# Patient Record
Sex: Female | Born: 1975 | Race: Black or African American | Hispanic: No | Marital: Single | Smoking: Never smoker
Health system: Southern US, Community
[De-identification: ages and names within clinical notes are randomized; demographics above are authoritative.]

## PROBLEM LIST (undated history)

## (undated) DIAGNOSIS — O24419 Gestational diabetes mellitus in pregnancy, unspecified control: Secondary | ICD-10-CM

## (undated) DIAGNOSIS — D649 Anemia, unspecified: Secondary | ICD-10-CM

## (undated) HISTORY — PX: NO PAST SURGERIES: SHX2092

## (undated) HISTORY — DX: Gestational diabetes mellitus in pregnancy, unspecified control: O24.419

## (undated) HISTORY — DX: Anemia, unspecified: D64.9

---

## 2003-08-02 ENCOUNTER — Encounter: Admission: RE | Admit: 2003-08-02 | Discharge: 2003-08-02 | Payer: Self-pay | Admitting: Family Medicine

## 2007-10-05 ENCOUNTER — Ambulatory Visit (HOSPITAL_COMMUNITY): Admission: RE | Admit: 2007-10-05 | Discharge: 2007-10-05 | Payer: Self-pay | Admitting: Obstetrics & Gynecology

## 2007-10-11 ENCOUNTER — Ambulatory Visit (HOSPITAL_COMMUNITY): Admission: RE | Admit: 2007-10-11 | Discharge: 2007-10-11 | Payer: Self-pay | Admitting: Obstetrics & Gynecology

## 2008-02-27 ENCOUNTER — Ambulatory Visit: Payer: Self-pay | Admitting: *Deleted

## 2008-02-27 ENCOUNTER — Inpatient Hospital Stay (HOSPITAL_COMMUNITY): Admission: AD | Admit: 2008-02-27 | Discharge: 2008-02-29 | Payer: Self-pay | Admitting: Family Medicine

## 2011-02-06 NOTE — Group Therapy Note (Signed)
   NAME:  Savannah Owens, Savannah Owens                             ACCOUNT NO.:  [id number]   MEDICAL RECORD NO.:  [id number]                   PATIENT TYPE:  OUT   LOCATION:  [facility]                           FACILITY:  [facility]   PHYSICIAN:  Argentina Donovan, MD                     DATE OF BIRTH:  Jan 18, 1976   DATE OF SERVICE:  08/02/2003                                    CLINIC NOTE   HISTORY OF PRESENT ILLNESS:  This patient is a 35 year old African female  [place] having delivered her first baby  six months ago.  She was seen in the clinic because of a lump near her  vagina which was a small inclusion cyst just inside the labia majoris on the  left side right near the fourchette.  It is painless.  She is not having any  pain with it.  I have discussed with her the treatment or since she is going  to want another baby soon it could be put off until she has the baby.  Since  she is not having any problem with it at the present time, there is no  reason to do any correction.   DIAGNOSES:  Inclusion cyst, vagina.  No treatment needed at this time.                                               Argentina Donovan, MD    PR/MEDQ  D:  08/02/2003  T:  08/03/2003  Job:  [id number]

## 2011-06-18 LAB — CBC
HCT: 24.3 — ABNORMAL LOW
HCT: 30.7 — ABNORMAL LOW
Hemoglobin: 10.5 — ABNORMAL LOW
Hemoglobin: 8.6 — ABNORMAL LOW
MCHC: 34.2
MCV: 87.1
MCV: 87.1
Platelets: 175
RBC: 3.53 — ABNORMAL LOW
RDW: 15.2
WBC: 9

## 2012-11-29 ENCOUNTER — Ambulatory Visit: Payer: No Typology Code available for payment source

## 2014-08-20 LAB — OB RESULTS CONSOLE RPR: RPR: NONREACTIVE

## 2014-08-20 LAB — OB RESULTS CONSOLE ANTIBODY SCREEN: Antibody Screen: NEGATIVE

## 2014-08-20 LAB — OB RESULTS CONSOLE ABO/RH: RH TYPE: POSITIVE

## 2014-08-20 LAB — OB RESULTS CONSOLE HIV ANTIBODY (ROUTINE TESTING): HIV: NONREACTIVE

## 2014-08-20 LAB — OB RESULTS CONSOLE HEPATITIS B SURFACE ANTIGEN: HEP B S AG: NEGATIVE

## 2014-08-20 LAB — OB RESULTS CONSOLE GC/CHLAMYDIA
CHLAMYDIA, DNA PROBE: NEGATIVE
GC PROBE AMP, GENITAL: NEGATIVE

## 2014-08-20 LAB — OB RESULTS CONSOLE RUBELLA ANTIBODY, IGM: RUBELLA: IMMUNE

## 2015-01-09 ENCOUNTER — Encounter: Payer: Medicaid Other | Attending: Obstetrics and Gynecology

## 2015-01-09 VITALS — Ht 64.0 in | Wt 180.1 lb

## 2015-01-09 DIAGNOSIS — R7302 Impaired glucose tolerance (oral): Secondary | ICD-10-CM | POA: Insufficient documentation

## 2015-01-09 DIAGNOSIS — Z713 Dietary counseling and surveillance: Secondary | ICD-10-CM | POA: Diagnosis not present

## 2015-01-09 DIAGNOSIS — R7309 Other abnormal glucose: Secondary | ICD-10-CM

## 2015-01-09 NOTE — Progress Notes (Signed)
  Patient was seen on 01/09/15 for Gestational Diabetes self-management class at the [facility]. The following learning objectives were met by the patient during this course:   States the definition of Gestational Diabetes  States why dietary management is important in controlling blood glucose  Describes the effects each nutrient has on blood glucose levels  Demonstrates ability to create a balanced meal plan  Demonstrates carbohydrate counting   States when to check blood glucose levels  Demonstrates proper blood glucose monitoring techniques  States the effect of stress and exercise on blood glucose levels  States the importance of limiting caffeine and abstaining from alcohol and smoking  Blood glucose monitor given: Accu Chek Nano BG Monitoring Kit  Lot # [id number] Exp: 09/21/15 Blood glucose reading: 78 mg/dl  Patient instructed to monitor glucose levels: FBS: 60 - <90 1 hour: <140 2 hour: <120  *Patient received handouts:  Nutrition Diabetes and Pregnancy  Carbohydrate Counting List  Patient will be seen for follow-up as needed.

## 2015-01-24 ENCOUNTER — Ambulatory Visit (HOSPITAL_COMMUNITY): Payer: Medicaid Other

## 2015-02-06 ENCOUNTER — Other Ambulatory Visit: Payer: Self-pay | Admitting: Obstetrics & Gynecology

## 2015-02-26 ENCOUNTER — Encounter (HOSPITAL_COMMUNITY): Payer: Self-pay

## 2015-02-27 ENCOUNTER — Other Ambulatory Visit (HOSPITAL_COMMUNITY): Payer: Medicaid Other

## 2015-02-27 ENCOUNTER — Encounter (HOSPITAL_COMMUNITY)
Admission: RE | Admit: 2015-02-27 | Discharge: 2015-02-27 | Disposition: A | Discharge status: Home / Self Care | Payer: Medicaid Other | Source: Ambulatory Visit | Attending: Obstetrics & Gynecology | Admitting: Obstetrics & Gynecology

## 2015-02-27 ENCOUNTER — Encounter (HOSPITAL_COMMUNITY): Payer: Self-pay

## 2015-02-27 LAB — BASIC METABOLIC PANEL
Anion gap: 6 (ref 5–15)
BUN: 7 mg/dL (ref 6–20)
CHLORIDE: 108 mmol/L (ref 101–111)
CO2: 24 mmol/L (ref 22–32)
Calcium: 8.9 mg/dL (ref 8.9–10.3)
Creatinine, Ser: 0.54 mg/dL (ref 0.44–1.00)
GFR calc non Af Amer: >60 mL/min (ref >60)
Glucose, Bld: 111 mg/dL — ABNORMAL HIGH (ref 65–99)
Potassium: 3.4 mmol/L — ABNORMAL LOW (ref 3.5–5.1)
Sodium: 138 mmol/L (ref 135–145)

## 2015-02-27 LAB — CBC
HEMATOCRIT: 29.9 % — AB (ref 36.0–46.0)
HEMOGLOBIN: 10.1 g/dL — AB (ref 12.0–15.0)
MCH: 27.9 pg (ref 26.0–34.0)
MCHC: 33.8 g/dL (ref 30.0–36.0)
MCV: 82.6 fL (ref 78.0–100.0)
Platelets: 160 10*3/uL (ref 150–400)
RBC: 3.62 MIL/uL — AB (ref 3.87–5.11)
RDW: 14.4 % (ref 11.5–15.5)
WBC: 6.1 10*3/uL (ref 4.0–10.5)

## 2015-02-27 LAB — ABO/RH: ABO/RH(D): O POS

## 2015-02-27 LAB — TYPE AND SCREEN
ABO/RH(D): O POS
Antibody Screen: NEGATIVE

## 2015-02-27 NOTE — Patient Instructions (Signed)
Your procedure is scheduled on:02/28/15  Enter through the Main Entrance at : 1030 am Pick up desk phone and dial [phone] and inform us of your arrival.  Please call [phone] if you have any problems the morning of surgery.  Remember: Do not eat food after midnight:tonight Clear liquids are ok until:8am on Thursday   You may brush your teeth the morning of surgery  DO NOT wear jewelry, eye make-up, lipstick,body lotion, or dark fingernail polish.  (Polished toes are ok) You may wear deodorant.  If you are to be admitted after surgery, leave suitcase in car until your room has been assigned. Patients discharged on the day of surgery will not be allowed to drive home. Wear loose fitting, comfortable clothes for your ride home.

## 2015-02-28 ENCOUNTER — Inpatient Hospital Stay (HOSPITAL_COMMUNITY)
Admission: AD | Admit: 2015-02-28 | Discharge: 2015-03-02 | DRG: 766 | Disposition: A | Discharge status: Home / Self Care | Payer: Medicaid Other | Source: Ambulatory Visit | Attending: Obstetrics & Gynecology | Admitting: Obstetrics & Gynecology

## 2015-02-28 ENCOUNTER — Inpatient Hospital Stay (HOSPITAL_COMMUNITY): Payer: Medicaid Other | Admitting: Anesthesiology

## 2015-02-28 ENCOUNTER — Encounter (HOSPITAL_COMMUNITY)
Admission: AD | Disposition: A | Discharge status: Home / Self Care | Payer: Self-pay | Source: Ambulatory Visit | Attending: Obstetrics & Gynecology

## 2015-02-28 ENCOUNTER — Encounter (HOSPITAL_COMMUNITY): Payer: Self-pay | Admitting: *Deleted

## 2015-02-28 DIAGNOSIS — O09523 Supervision of elderly multigravida, third trimester: Secondary | ICD-10-CM | POA: Diagnosis not present

## 2015-02-28 DIAGNOSIS — O99824 Streptococcus B carrier state complicating childbirth: Secondary | ICD-10-CM | POA: Diagnosis present

## 2015-02-28 DIAGNOSIS — O321XX0 Maternal care for breech presentation, not applicable or unspecified: Principal | ICD-10-CM | POA: Diagnosis present

## 2015-02-28 DIAGNOSIS — Z3A39 39 weeks gestation of pregnancy: Secondary | ICD-10-CM | POA: Diagnosis present

## 2015-02-28 DIAGNOSIS — O24420 Gestational diabetes mellitus in childbirth, diet controlled: Secondary | ICD-10-CM | POA: Diagnosis present

## 2015-02-28 DIAGNOSIS — Z302 Encounter for sterilization: Secondary | ICD-10-CM

## 2015-02-28 HISTORY — PX: TUBAL LIGATION: SHX77

## 2015-02-28 LAB — GLUCOSE, CAPILLARY
Glucose-Capillary: 86 mg/dL (ref 65–99)
Glucose-Capillary: 85 mg/dL (ref 65–99)

## 2015-02-28 LAB — RPR: RPR Ser Ql: NONREACTIVE

## 2015-02-28 SURGERY — Surgical Case
Anesthesia: Spinal | Site: Abdomen

## 2015-02-28 MED ORDER — OXYTOCIN 10 UNIT/ML IJ SOLN
INTRAMUSCULAR | Status: AC
  Filled 2015-02-28: qty 4

## 2015-02-28 MED ORDER — PHENYLEPHRINE HCL 10 MG/ML IJ SOLN
INTRAMUSCULAR | Status: DC | PRN
  Administered 2015-02-28: 40 ug via INTRAVENOUS
  Administered 2015-02-28: 80 ug via INTRAVENOUS
  Administered 2015-02-28 (×2): 40 ug via INTRAVENOUS
  Administered 2015-02-28 (×2): 80 ug via INTRAVENOUS

## 2015-02-28 MED ORDER — NALBUPHINE HCL 10 MG/ML IJ SOLN: 5.0000 mg | Freq: Once | INTRAMUSCULAR | Status: AC | PRN

## 2015-02-28 MED ORDER — HYDROMORPHONE HCL 1 MG/ML IJ SOLN: 0.2500 mg | INTRAMUSCULAR | Status: DC | PRN

## 2015-02-28 MED ORDER — LACTATED RINGERS IV SOLN
INTRAVENOUS | Status: DC
  Administered 2015-02-28: 23:00:00 via INTRAVENOUS

## 2015-02-28 MED ORDER — DEXTROSE 5 % IV SOLN
1.0000 ug/kg/h | INTRAVENOUS | Status: DC | PRN
  Filled 2015-02-28: qty 2

## 2015-02-28 MED ORDER — SIMETHICONE 80 MG PO CHEW
80.0000 mg | CHEWABLE_TABLET | ORAL | Status: DC
  Administered 2015-03-01 – 2015-03-02 (×2): 80 mg via ORAL
  Filled 2015-02-28 (×2): qty 1

## 2015-02-28 MED ORDER — TETANUS-DIPHTH-ACELL PERTUSSIS 5-2.5-18.5 LF-MCG/0.5 IM SUSP: 0.5000 mL | Freq: Once | INTRAMUSCULAR | Status: DC

## 2015-02-28 MED ORDER — CEFAZOLIN SODIUM-DEXTROSE 2-3 GM-% IV SOLR
2.0000 g | INTRAVENOUS | Status: AC
  Administered 2015-02-28: 2 g via INTRAVENOUS

## 2015-02-28 MED ORDER — PRENATAL MULTIVITAMIN CH
1.0000 | ORAL_TABLET | Freq: Every day | ORAL | Status: DC
  Administered 2015-03-01 – 2015-03-02 (×2): 1 via ORAL
  Filled 2015-02-28 (×2): qty 1

## 2015-02-28 MED ORDER — ONDANSETRON HCL 4 MG/2ML IJ SOLN: 4.0000 mg | Freq: Three times a day (TID) | INTRAMUSCULAR | Status: DC | PRN

## 2015-02-28 MED ORDER — DIPHENHYDRAMINE HCL 25 MG PO CAPS
25.0000 mg | ORAL_CAPSULE | Freq: Four times a day (QID) | ORAL | Status: DC | PRN
Start: 2015-02-28 — End: 2015-03-02

## 2015-02-28 MED ORDER — SCOPOLAMINE 1 MG/3DAYS TD PT72
MEDICATED_PATCH | TRANSDERMAL | Status: AC
  Administered 2015-02-28: 1.5 mg via TRANSDERMAL
  Filled 2015-02-28: qty 1

## 2015-02-28 MED ORDER — IBUPROFEN 600 MG PO TABS
600.0000 mg | ORAL_TABLET | Freq: Four times a day (QID) | ORAL | Status: DC
  Administered 2015-02-28 – 2015-03-02 (×8): 600 mg via ORAL
  Filled 2015-02-28 (×8): qty 1

## 2015-02-28 MED ORDER — SODIUM CHLORIDE 0.9 % IJ SOLN: 3.0000 mL | INTRAMUSCULAR | Status: DC | PRN

## 2015-02-28 MED ORDER — SIMETHICONE 80 MG PO CHEW: 80.0000 mg | CHEWABLE_TABLET | ORAL | Status: DC | PRN

## 2015-02-28 MED ORDER — LACTATED RINGERS IV SOLN
INTRAVENOUS | Status: DC | PRN
  Administered 2015-02-28: 13:00:00 via INTRAVENOUS

## 2015-02-28 MED ORDER — DIPHENHYDRAMINE HCL 25 MG PO CAPS
25.0000 mg | ORAL_CAPSULE | ORAL | Status: DC | PRN
  Administered 2015-02-28 – 2015-03-01 (×2): 25 mg via ORAL
  Filled 2015-02-28 (×3): qty 1

## 2015-02-28 MED ORDER — BUPIVACAINE HCL (PF) 0.25 % IJ SOLN
INTRAMUSCULAR | Status: AC
  Filled 2015-02-28: qty 30

## 2015-02-28 MED ORDER — SIMETHICONE 80 MG PO CHEW
80.0000 mg | CHEWABLE_TABLET | Freq: Three times a day (TID) | ORAL | Status: DC
  Administered 2015-02-28 – 2015-03-02 (×4): 80 mg via ORAL
  Filled 2015-02-28 (×4): qty 1

## 2015-02-28 MED ORDER — FENTANYL CITRATE (PF) 100 MCG/2ML IJ SOLN
INTRAMUSCULAR | Status: AC
  Filled 2015-02-28: qty 2

## 2015-02-28 MED ORDER — VITAMIN K1 1 MG/0.5ML IJ SOLN
INTRAMUSCULAR | Status: AC
Start: 2015-02-28 — End: 2015-03-01
  Filled 2015-02-28: qty 0.5

## 2015-02-28 MED ORDER — FENTANYL CITRATE (PF) 100 MCG/2ML IJ SOLN
INTRAMUSCULAR | Status: DC | PRN
  Administered 2015-02-28: 25 ug via INTRATHECAL

## 2015-02-28 MED ORDER — DIPHENHYDRAMINE HCL 50 MG/ML IJ SOLN: 12.5000 mg | INTRAMUSCULAR | Status: DC | PRN

## 2015-02-28 MED ORDER — NALOXONE HCL 0.4 MG/ML IJ SOLN: 0.4000 mg | INTRAMUSCULAR | Status: DC | PRN

## 2015-02-28 MED ORDER — NALBUPHINE HCL 10 MG/ML IJ SOLN
5.0000 mg | INTRAMUSCULAR | Status: DC | PRN
  Administered 2015-02-28: 5 mg via INTRAVENOUS
  Filled 2015-02-28: qty 1

## 2015-02-28 MED ORDER — ONDANSETRON HCL 4 MG/2ML IJ SOLN
INTRAMUSCULAR | Status: DC | PRN
  Administered 2015-02-28: 4 mg via INTRAVENOUS

## 2015-02-28 MED ORDER — CEFAZOLIN SODIUM-DEXTROSE 2-3 GM-% IV SOLR
INTRAVENOUS | Status: AC
  Filled 2015-02-28: qty 50

## 2015-02-28 MED ORDER — SCOPOLAMINE 1 MG/3DAYS TD PT72: 1.0000 | MEDICATED_PATCH | Freq: Once | TRANSDERMAL | Status: DC

## 2015-02-28 MED ORDER — LANOLIN HYDROUS EX OINT: 1.0000 "application " | TOPICAL_OINTMENT | CUTANEOUS | Status: DC | PRN

## 2015-02-28 MED ORDER — OXYCODONE-ACETAMINOPHEN 5-325 MG PO TABS
1.0000 | ORAL_TABLET | ORAL | Status: DC | PRN
  Administered 2015-03-02 (×2): 1 via ORAL
  Filled 2015-02-28 (×2): qty 1

## 2015-02-28 MED ORDER — ACETAMINOPHEN 500 MG PO TABS
1000.0000 mg | ORAL_TABLET | Freq: Four times a day (QID) | ORAL | Status: AC
  Administered 2015-03-01 (×2): 1000 mg via ORAL
  Filled 2015-02-28 (×2): qty 2

## 2015-02-28 MED ORDER — ACETAMINOPHEN 325 MG PO TABS: 650.0000 mg | ORAL_TABLET | ORAL | Status: DC | PRN

## 2015-02-28 MED ORDER — DIBUCAINE 1 % RE OINT: 1.0000 "application " | TOPICAL_OINTMENT | RECTAL | Status: DC | PRN

## 2015-02-28 MED ORDER — LACTATED RINGERS IV SOLN
INTRAVENOUS | Status: DC
  Administered 2015-02-28 (×4): via INTRAVENOUS

## 2015-02-28 MED ORDER — NALBUPHINE HCL 10 MG/ML IJ SOLN: 5.0000 mg | INTRAMUSCULAR | Status: DC | PRN

## 2015-02-28 MED ORDER — PHENYLEPHRINE 8 MG IN D5W 100 ML (0.08MG/ML) PREMIX OPTIME
INJECTION | INTRAVENOUS | Status: DC | PRN
  Administered 2015-02-28: 60 ug/min via INTRAVENOUS

## 2015-02-28 MED ORDER — WITCH HAZEL-GLYCERIN EX PADS: 1.0000 "application " | MEDICATED_PAD | CUTANEOUS | Status: DC | PRN

## 2015-02-28 MED ORDER — OXYCODONE-ACETAMINOPHEN 5-325 MG PO TABS
2.0000 | ORAL_TABLET | ORAL | Status: DC | PRN
  Administered 2015-03-01 – 2015-03-02 (×6): 2 via ORAL
  Filled 2015-02-28 (×6): qty 2

## 2015-02-28 MED ORDER — MEPERIDINE HCL 25 MG/ML IJ SOLN: 6.2500 mg | INTRAMUSCULAR | Status: DC | PRN

## 2015-02-28 MED ORDER — SENNOSIDES-DOCUSATE SODIUM 8.6-50 MG PO TABS
2.0000 | ORAL_TABLET | ORAL | Status: DC
  Administered 2015-03-01 – 2015-03-02 (×2): 2 via ORAL
  Filled 2015-02-28 (×2): qty 2

## 2015-02-28 MED ORDER — BUPIVACAINE HCL (PF) 0.25 % IJ SOLN
INTRAMUSCULAR | Status: DC | PRN
Start: 2015-02-28 — End: 2015-02-28
  Administered 2015-02-28: 30 mL

## 2015-02-28 MED ORDER — ERYTHROMYCIN 5 MG/GM OP OINT
TOPICAL_OINTMENT | OPHTHALMIC | Status: AC
  Filled 2015-02-28: qty 1

## 2015-02-28 MED ORDER — SCOPOLAMINE 1 MG/3DAYS TD PT72
1.0000 | MEDICATED_PATCH | Freq: Once | TRANSDERMAL | Status: DC
  Administered 2015-02-28: 1.5 mg via TRANSDERMAL

## 2015-02-28 MED ORDER — OXYTOCIN 40 UNITS IN LACTATED RINGERS INFUSION - SIMPLE MED: 62.5000 mL/h | INTRAVENOUS | Status: AC

## 2015-02-28 MED ORDER — MENTHOL 3 MG MT LOZG: 1.0000 | LOZENGE | OROMUCOSAL | Status: DC | PRN

## 2015-02-28 MED ORDER — BUPIVACAINE LIPOSOME 1.3 % IJ SUSP
20.0000 mL | Freq: Once | INTRAMUSCULAR | Status: AC
  Administered 2015-02-28: 20 mL
  Filled 2015-02-28: qty 20

## 2015-02-28 MED ORDER — ONDANSETRON HCL 4 MG/2ML IJ SOLN
INTRAMUSCULAR | Status: AC
  Filled 2015-02-28: qty 2

## 2015-02-28 MED ORDER — MORPHINE SULFATE (PF) 0.5 MG/ML IJ SOLN
INTRAMUSCULAR | Status: DC | PRN
  Administered 2015-02-28: .1 mg via INTRATHECAL

## 2015-02-28 MED ORDER — ZOLPIDEM TARTRATE 5 MG PO TABS: 5.0000 mg | ORAL_TABLET | Freq: Every evening | ORAL | Status: DC | PRN

## 2015-02-28 MED ORDER — LACTATED RINGERS IV SOLN
Freq: Once | INTRAVENOUS | Status: AC
  Administered 2015-02-28: 11:00:00 via INTRAVENOUS

## 2015-02-28 MED ORDER — MORPHINE SULFATE 0.5 MG/ML IJ SOLN
INTRAMUSCULAR | Status: AC
  Filled 2015-02-28: qty 10

## 2015-02-28 MED ORDER — BUPIVACAINE HCL (PF) 0.25 % IJ SOLN
INTRAMUSCULAR | Status: AC
Start: 2015-02-28 — End: 2015-02-28
  Filled 2015-02-28: qty 30

## 2015-02-28 MED ORDER — OXYTOCIN 10 UNIT/ML IJ SOLN
40.0000 [IU] | INTRAMUSCULAR | Status: DC | PRN
  Administered 2015-02-28: 40 [IU] via INTRAVENOUS

## 2015-02-28 MED ORDER — PHENYLEPHRINE 8 MG IN D5W 100 ML (0.08MG/ML) PREMIX OPTIME
INJECTION | INTRAVENOUS | Status: AC
  Filled 2015-02-28: qty 100

## 2015-02-28 SURGICAL SUPPLY — 33 items
BENZOIN TINCTURE PRP APPL 2/3 (GAUZE/BANDAGES/DRESSINGS) ×4 IMPLANT
CLAMP CORD UMBIL (MISCELLANEOUS) ×4 IMPLANT
CLIP FILSHIE TUBAL LIGA STRL (Clip) ×4 IMPLANT
CLOSURE WOUND 1/2 X4 (GAUZE/BANDAGES/DRESSINGS) ×1
CLOTH BEACON ORANGE TIMEOUT ST (SAFETY) ×4 IMPLANT
DRAPE SHEET LG 3/4 BI-LAMINATE (DRAPES) ×4 IMPLANT
DRSG OPSITE POSTOP 4X10 (GAUZE/BANDAGES/DRESSINGS) ×4 IMPLANT
DURAPREP 26ML APPLICATOR (WOUND CARE) ×4 IMPLANT
ELECT REM PT RETURN 9FT ADLT (ELECTROSURGICAL) ×4
ELECTRODE REM PT RTRN 9FT ADLT (ELECTROSURGICAL) ×2 IMPLANT
GLOVE BIO SURGEON STRL SZ 6.5 (GLOVE) ×3 IMPLANT
GLOVE BIO SURGEONS STRL SZ 6.5 (GLOVE) ×1
GLOVE BIOGEL PI IND STRL 7.0 (GLOVE) ×2 IMPLANT
GLOVE BIOGEL PI INDICATOR 7.0 (GLOVE) ×2
GOWN STRL REUS W/TWL LRG LVL3 (GOWN DISPOSABLE) ×12 IMPLANT
NEEDLE HYPO 22GX1.5 SAFETY (NEEDLE) ×4 IMPLANT
NS IRRIG 1000ML POUR BTL (IV SOLUTION) ×4 IMPLANT
PACK C SECTION WH (CUSTOM PROCEDURE TRAY) ×4 IMPLANT
PAD ABD 7.5X8 STRL (GAUZE/BANDAGES/DRESSINGS) ×4 IMPLANT
PAD OB MATERNITY 4.3X12.25 (PERSONAL CARE ITEMS) ×4 IMPLANT
RTRCTR C-SECT PINK 25CM LRG (MISCELLANEOUS) ×4 IMPLANT
SPONGE GAUZE 4X4 12PLY STER LF (GAUZE/BANDAGES/DRESSINGS) ×8 IMPLANT
STRIP CLOSURE SKIN 1/2X4 (GAUZE/BANDAGES/DRESSINGS) ×3 IMPLANT
SUT CHROMIC 2 0 CT 1 (SUTURE) ×8 IMPLANT
SUT MNCRL 0 VIOLET CTX 36 (SUTURE) ×4 IMPLANT
SUT MONOCRYL 0 CTX 36 (SUTURE) ×4
SUT VIC AB 0 CTX 36 (SUTURE) ×4
SUT VIC AB 0 CTX36XBRD ANBCTRL (SUTURE) ×4 IMPLANT
SUT VIC AB 4-0 KS 27 (SUTURE) ×4 IMPLANT
SYR CONTROL 10ML LL (SYRINGE) ×4 IMPLANT
TAPE CLOTH SURG 4X10 WHT LF (GAUZE/BANDAGES/DRESSINGS) ×4 IMPLANT
TOWEL OR 17X24 6PK STRL BLUE (TOWEL DISPOSABLE) ×4 IMPLANT
TRAY FOLEY CATH SILVER 14FR (SET/KITS/TRAYS/PACK) ×4 IMPLANT

## 2015-02-28 NOTE — OR Nursing (Signed)
Dr Cristela Blue will sign pt out. Margarita Mail rn

## 2015-02-28 NOTE — Op Note (Addendum)
Cesarean Section Procedure Note  Indications: 39 week SIUP breech presentation, Desired permanent sterilization  Pre-operative Diagnosis: 39 week 2 day pregnancy, complete breech presentation  Post-operative Diagnosis: same  Procedure:  Primary cesarean delivery                        Bilateral tubal ligation  Surgeon: Wynonia Hazard, MD  Assistants: Marlow Baars,  MD  Anesthesia: Spinal anesthesia  ASA Class: 2  Procedure Details   The patient was counseled about the risks, benefits, complications of the cesarean section. The patient concurred with the proposed plan, giving informed consent.  The site of surgery properly noted/marked. The patient was taken to Operating Room # 9, identified as Savannah Owens and the procedure verified as C-Section Delivery. A Time Out was held and the above information confirmed.  After epidural was found to adequate , the patient was placed in the dorsal supine position with a leftward tilt, draped and prepped in the usual sterile manner. A Pfannenstiel incision was made and carried down through the subcutaneous tissue to the fascia.  The fascia was incised in the midline and the fascial incision was extended laterally with Mayo scissors. The superior aspect of the fascial incision was grasped with Coker clamps x2, tented up and the rectus muscles dissected off sharply with the bovie.  The rectus was then dissected off with blunt dissection and Mayo scissors inferiorly. The rectus muscles were separated in the midline. The abdominal peritoneum was identified, tented up, entered sharply using Metzenbaum scissors, and the incision was extended superiorly and inferiorly with good visualization of the bladder. The Alexis retractor was then deployed. The vesicouterine peritoneum was identified, tented up, entered sharply with Metzenbaum scissors, and the bladder flap was created digitally. Scalpel was then used to make a low transverse incision on the uterus  which was extended laterally with  blunt dissection. The fetal breech was identified, each leg was delivered  The body was delivered until the shoulders.  Each arm was delivered via Pinard maneuver. The head was delivered via Domenic Schwab, [place] maneuver. The A live female infant was bulb suctioned on the operative field cried vigorously, cord was clamped and cut and the infant was passed to the waiting neonatologist. Apgars 8/9. Placenta was then delivered spontaneously, intact and appear normal, the uterus was cleared of all clot and debris. The uterine incision was repaired with #0 Monocryl in running locked fashion. A second imbricating suture was performed using the same suture. The incision was hemostatic. Ovaries and tubes were inspected and normal. The uterus was delivered from the abdomen and Filshie clips were applied to each fallopian tube at the isthmic portion. The uterus was replaced in the abdomen. Exparel was placed on each tube. The Alexis retractor was removed. The abdominal cavity was cleared of all clot and debris. The abdominal peritoneum was reapproximated with 2-0 chromic  in a running fashion, the rectus muscles was reapproximated with #2 chromic in interrupted fashion. The fascia was closed with 0 Vicryl in a running fashion. The subcuticular layer was irrigated and all bleeders cauterized.  20 mL of Exparel diluted in 30 mL of 0.25% Marcaine was injected into the subcutaneous layer.  The Scarpas fascia was re-approximated with interrupted sutures of 2-0 plain.  The skin was closed with 4-0 vicryl in a subcuticular fashion using a Mellody Dance needle. The incision was dressed with benzoine, steri strips and pressure dressing. All sponge lap and needle counts were correct  x3.   Patient tolerated the procedure well and recovered in stable condition following the procedure.  Instrument, sponge, and needle counts were correct prior the abdominal closure and at the conclusion of the case.    Findings: Live female infant, Apgars 8/9, clear amniotic fluid, normal appearing placenta, normal uterus, bilateral tubes and ovaries  Estimated Blood Loss:  IVF:  3000 mL LR         Drains: Foley catheter  Urine output: 450 mL clear         Specimens: Placenta to L&D         Implants: none         Complications:  None; patient tolerated the procedure well.         Disposition: PACU - hemodynamically stable.   Savannah Owens Savannah Owens

## 2015-02-28 NOTE — Lactation Note (Signed)
This note was copied from the chart of Savannah Owens. Lactation Consultation Note. Initial visit at 8 hours of age.  Mom reports baby is sleepy and she doesn't know how much he is getting.  RN reports  A good feeding earlier.  Baby asleep in crib, but mom want to try to latch baby.  Last feeding about 1 hour ago for about 10 minutes.  Attempted hand expression for several minutes.  Left breast soft and right more full.  Small drops of colostrum noted.  Encouraged mom to continue to hand express.  Assisted with football and then cross cradle latch.  Baby latched with mouth open and few sucks but did not maintain feeding.  Baby asleep STS with mom.  Hand pump given with instructions.  Mom has WIC and reports they will give her a pump in 3 months.  Discussed on demand feedings for 2 weeks and then to try some pumping. [facility] LC resources given and discussed.  Encouraged to feed with early cues on demand.  Early newborn behavior discussed.  Mom to call for assist as needed.     Patient Name: Savannah Owens [id number] Date: 02/28/2015 Reason for consult: Initial assessment   Maternal Data Has patient been taught Hand Expression?: Yes Does the patient have breastfeeding experience prior to this delivery?: Yes  Feeding Feeding Type: Breast Fed Length of feed:  (few minutes)  LATCH Score/Interventions Latch: Repeated attempts needed to sustain latch, nipple held in mouth throughout feeding, stimulation needed to elicit sucking reflex. Intervention(s): Adjust position;Assist with latch;Breast massage;Breast compression  Audible Swallowing: None Intervention(s): Skin to skin Intervention(s): Skin to skin  Type of Nipple: Everted at rest and after stimulation  Comfort (Breast/Nipple): Soft / non-tender     Hold (Positioning): Assistance needed to correctly position infant at breast and maintain latch. Intervention(s): Breastfeeding basics reviewed;Support Pillows;Position options;Skin to skin  LATCH  Score: 6  Lactation Tools Discussed/Used WIC Program: Yes Pump Review: Setup, frequency, and cleaning;Milk Storage Date initiated:: 02/28/15   Consult Status Consult Status: Follow-up Date: 03/01/15 Follow-up type: In-patient    Hoyte Ziebell, Jannifer Rodney RN BSN IBCLC 02/28/2015, 9:34 PM

## 2015-02-28 NOTE — Transfer of Care (Signed)
Immediate Anesthesia Transfer of Care Note  Patient: Savannah Owens  Procedure(s) Performed: Procedure(s): CESAREAN SECTION (N/A) BILATERAL TUBAL LIGATION (Bilateral)  Patient Location: PACU  Anesthesia Type:Spinal  Level of Consciousness: awake, alert  and oriented  Airway & Oxygen Therapy: Patient Spontanous Breathing  Post-op Assessment: Report given to RN and Post -op Vital signs reviewed and stable  Post vital signs: Reviewed and stable  Last Vitals:  Filed Vitals:   02/28/15 1036  BP: 114/69  Pulse: 73  Temp: 36.4 C  Resp: 18    Complications: No apparent anesthesia complications

## 2015-02-28 NOTE — Anesthesia Postprocedure Evaluation (Signed)
  Anesthesia Post-op Note  Patient: Savannah Owens  Procedure(s) Performed: Procedure(s): CESAREAN SECTION (N/A) BILATERAL TUBAL LIGATION (Bilateral)  Patient is awake, responsive, moving her legs, and has signs of resolution of her numbness. Pain and nausea are reasonably well controlled. Vital signs are stable and clinically acceptable. Oxygen saturation is clinically acceptable. There are no apparent anesthetic complications at this time. Patient is ready for discharge.

## 2015-02-28 NOTE — Anesthesia Preprocedure Evaluation (Signed)
Anesthesia Evaluation  Patient identified by MRN, date of birth, ID band Patient awake    Reviewed: Allergy & Precautions, H&P , Patient's Chart, lab work & pertinent test results  Airway Mallampati: II  TM Distance: >3 FB Neck ROM: full    Dental no notable dental hx.    Pulmonary  breath sounds clear to auscultation  Pulmonary exam normal       Cardiovascular Exercise Tolerance: Good Rhythm:regular Rate:Normal     Neuro/Psych    GI/Hepatic   Endo/Other  diabetes, Type 2  Renal/GU      Musculoskeletal   Abdominal   Peds  Hematology   Anesthesia Other Findings   Reproductive/Obstetrics                             Anesthesia Physical Anesthesia Plan  ASA: II  Anesthesia Plan: Spinal   Post-op Pain Management:    Induction:   Airway Management Planned:   Additional Equipment:   Intra-op Plan:   Post-operative Plan:   Informed Consent: I have reviewed the patients History and Physical, chart, labs and discussed the procedure including the risks, benefits and alternatives for the proposed anesthesia with the patient or authorized representative who has indicated his/her understanding and acceptance.   Dental Advisory Given  Plan Discussed with: CRNA  Anesthesia Plan Comments: (Lab work confirmed with CRNA in room. Platelets okay. Discussed spinal anesthetic, and patient consents to the procedure:  included risk of possible headache,backache, failed block, allergic reaction, and nerve injury. This patient was asked if she had any questions or concerns before the procedure started. )        Anesthesia Quick Evaluation

## 2015-02-28 NOTE — H&P (Signed)
Savannah Owens is a 39 y.o. female presenting for elective primary cesarean delivery for breech presentation. She denies ctx, no vaginal bleeding, no leaking of fluid. She notes good fetal movement.   Maternal Medical History:  Reason for admission: Nausea. Scheduled primary cesarean delivery for breech presentation  Contractions: Frequency: rare.   Perceived severity is mild.    Fetal activity: Perceived fetal activity is normal.   Last perceived fetal movement was within the past 12 hours.    Prenatal complications: no prenatal complications Prenatal Complications - Diabetes: gestational. Diabetes is managed by diet.      OB History    Gravida Para Term Preterm AB TAB SAB Ectopic Multiple Living   3 2        2      Past Medical History  Diagnosis Date  . GDM (gestational diabetes mellitus)   . Anemia    Past Surgical History  Procedure Laterality Date  . No past surgeries     Family History: family history is not on file. Social History:  reports that she has never smoked. She does not have any smokeless tobacco history on file. She reports that she does not drink alcohol or use illicit drugs.   Prenatal Transfer Tool  Maternal Diabetes: Yes:  Diabetes Type:  Diet controlled Genetic Screening: Normal Maternal Ultrasounds/Referrals: Normal Fetal Ultrasounds or other Referrals:  Other: Anatomy scan normal Maternal Substance Abuse:  No Significant Maternal Medications:  None Significant Maternal Lab Results:  Lab values include: Group B Strep positive Other Comments:  None  Review of Systems  Constitutional: Negative.  Negative for fever and chills.  HENT: Negative for hearing loss.   Eyes: Negative.  Negative for blurred vision and double vision.  Respiratory: Negative for cough.   Cardiovascular: Negative for chest pain and palpitations.  Gastrointestinal: Negative for heartburn and nausea.  Genitourinary: Negative for dysuria and urgency.  Musculoskeletal:  Negative for myalgias.  Skin: Negative for itching and rash.  Neurological: Negative for dizziness, tingling and headaches.  Psychiatric/Behavioral: Negative for depression and suicidal ideas.  All other systems reviewed and are negative.     Blood pressure 114/69, pulse 73, temperature 97.5 F (36.4 C), temperature source Oral, resp. rate 18, last menstrual period 05/28/2014, SpO2 100 %. Maternal Exam:  Uterine Assessment: Contraction frequency is rare.   Abdomen: Patient reports no abdominal tenderness. Fundal height is 40 cm.   Estimated fetal weight is 3600 grams.   Fetal presentation: breech  Introitus: Normal vulva. Normal vagina.  Ferning test: not done.  Nitrazine test: not done. Amniotic fluid character: not assessed.     Fetal Exam Fetal Monitor Review: Baseline rate: 140.  Variability: moderate (6-25 bpm).   Pattern: accelerations present and no decelerations.    Fetal State Assessment: Category I - tracings are normal.     Physical Exam  Vitals reviewed. Constitutional: She appears well-developed and well-nourished.  HENT:  Head: Normocephalic.  Eyes: Pupils are equal, round, and reactive to light.  Neck: Normal range of motion.  Respiratory: Effort normal.  GI: Soft.  Musculoskeletal: Normal range of motion.  Neurological: She is alert. She has normal reflexes.  Skin: Skin is warm.    Prenatal labs: ABO, Rh: --/--/O POS, O POS (06/08 1030) Antibody: NEG (06/08 1030) Rubella: Immune (11/30 0000) RPR: Non Reactive (06/08 1030)  HBsAg: Negative (11/30 0000)  HIV: Non-reactive (11/30 0000)  GBS:   POS  Assessment/Plan: 39 yo G3P2 at 39 weeks breech presentation A1GDM Patient on call to  OR for primary C-section Ancef preoperatively Patient desires permanent sterilization - BTL     Savannah Owens 02/28/2015, 11:47 AM

## 2015-02-28 NOTE — Anesthesia Procedure Notes (Signed)

## 2015-03-01 LAB — CBC
HCT: 25.7 % — ABNORMAL LOW (ref 36.0–46.0)
Hemoglobin: 8.7 g/dL — ABNORMAL LOW (ref 12.0–15.0)
MCH: 28 pg (ref 26.0–34.0)
MCHC: 33.9 g/dL (ref 30.0–36.0)
MCV: 82.6 fL (ref 78.0–100.0)
Platelets: 152 10*3/uL (ref 150–400)
RBC: 3.11 MIL/uL — ABNORMAL LOW (ref 3.87–5.11)
RDW: 14.5 % (ref 11.5–15.5)
WBC: 9.2 10*3/uL (ref 4.0–10.5)

## 2015-03-01 LAB — BIRTH TISSUE RECOVERY COLLECTION (PLACENTA DONATION)

## 2015-03-01 NOTE — Addendum Note (Signed)
Addendum  created 03/01/15 0815 by Algis Greenhouse, CRNA   Modules edited: Notes Section   Notes Section:  File: [id number]

## 2015-03-01 NOTE — Progress Notes (Signed)
Plan for circ as outpatient in office.

## 2015-03-01 NOTE — Anesthesia Postprocedure Evaluation (Signed)
Anesthesia Post Note  Patient: Savannah Owens  Procedure(s) Performed: Procedure(s) (LRB): CESAREAN SECTION (N/A) BILATERAL TUBAL LIGATION (Bilateral)  Anesthesia type: Spinal  Patient location: Mother/Baby  Post pain: Pain level controlled  Post assessment: Post-op Vital signs reviewed  Last Vitals:  Filed Vitals:   03/01/15 0600  BP: 104/55  Pulse: 68  Temp: 36.9 C  Resp: 18    Post vital signs: Reviewed  Level of consciousness: awake  Complications: No apparent anesthesia complications

## 2015-03-01 NOTE — Progress Notes (Signed)
UR chart review completed.  

## 2015-03-01 NOTE — Progress Notes (Signed)
Patient is doing well.  She is tolerating PO, ambulating, voiding.  Pain is controlled.  Lochia is appropriate  Filed Vitals:   03/01/15 0001 03/01/15 0149 03/01/15 0600 03/01/15 0900  BP:  101/55 104/55 110/62  Pulse:  68 68 73  Temp:  98.6 F (37 C) 98.5 F (36.9 C) 98.4 F (36.9 C)  TempSrc:      Resp: 18 18 18 16   SpO2:  100% 100% 99%    NAD Lungs:   clear to auscultation Heart:   RRR Abdomen:  soft, appropriate tenderness, incisions intact and without erythema or drainage ext:    Symmetric, no edema bilaterally  Lab Results  Component Value Date   WBC 9.2 03/01/2015   HGB 8.7* 03/01/2015   HCT 25.7* 03/01/2015   MCV 82.6 03/01/2015   PLT 152 03/01/2015    --/--/O POS, O POS (06/08 1030)/RImmune  A/P    39 y.o. G3P1001 POD 1 s/p 1 LTCS and BTL for breech presentation Routine post op and postpartum care.   Plan for circ tomorrow

## 2015-03-02 ENCOUNTER — Encounter (HOSPITAL_COMMUNITY): Payer: Self-pay | Admitting: Obstetrics & Gynecology

## 2015-03-02 LAB — GLUCOSE, CAPILLARY: Glucose-Capillary: 94 mg/dL (ref 65–99)

## 2015-03-02 MED ORDER — OXYCODONE-ACETAMINOPHEN 5-325 MG PO TABS
1.0000 | ORAL_TABLET | Freq: Four times a day (QID) | ORAL | Status: DC | PRN
Start: 2015-03-02 — End: 2017-11-19

## 2015-03-02 MED ORDER — IBUPROFEN 600 MG PO TABS: 600.0000 mg | ORAL_TABLET | Freq: Four times a day (QID) | ORAL | Status: AC | PRN

## 2015-03-02 NOTE — Discharge Instructions (Signed)
Pelvic rest x 2 weeks (no intercourse or tampons).  No tub baths or swimming for two weeks.    Do not drive until you are not taking narcotic pain medication.   Call your doctor if you have heavy vaginal bleeding (soaking through a pad an hour or more for >2 hours in a row), temperature >101F, severe nausea, vomiting, severe or worsening abdominal pain, dizziness, shortness of breath, chest pain or any other concerns.  Please take motrin every 6 hours.  Add percocet if your pain is not controlled by motrin alone. You have been prescribed an antibiotic.  Please take one tab twice daily for 3 days.   Postpartum Care After Cesarean Delivery After you deliver your newborn (postpartum period), the usual stay in the hospital is 24-72 hours. If there were problems with your labor or delivery, or if you have other medical problems, you might be in the hospital longer.  While you are in the hospital, you will receive help and instructions on how to care for yourself and your newborn during the postpartum period.  While you are in the hospital:  It is normal for you to have pain or discomfort from the incision in your abdomen. Be sure to tell your nurses when you are having pain, where the pain is located, and what makes the pain worse.  If you are breastfeeding, you may feel uncomfortable contractions of your uterus for a couple of weeks. This is normal. The contractions help your uterus get back to normal size.  It is normal to have some bleeding after delivery.  For the first 1-3 days after delivery, the flow is red and the amount may be similar to a period.  It is common for the flow to start and stop.  In the first few days, you may pass some small clots. Let your nurses know if you begin to pass large clots or your flow increases.  Do not  flush blood clots down the toilet before having the nurse look at them.  During the next 3-10 days after delivery, your flow should become more watery and  pink or brown-tinged in color.  Ten to fourteen days after delivery, your flow should be a small amount of yellowish-white discharge.  The amount of your flow will decrease over the first few weeks after delivery. Your flow may stop in 6-8 weeks. Most women have had their flow stop by 12 weeks after delivery.  You should change your sanitary pads frequently.  Wash your hands thoroughly with soap and water for at least 20 seconds after changing pads, using the toilet, or before holding or feeding your newborn.  Your intravenous (IV) tubing will be removed when you are drinking enough fluids.  The urine drainage tube (urinary catheter) that was inserted before delivery may be removed within 6-8 hours after delivery or when feeling returns to your legs. You should feel like you need to empty your bladder within the first 6-8 hours after the catheter has been removed.  In case you become weak, lightheaded, or faint, call your nurse before you get out of bed for the first time and before you take a shower for the first time.  Within the first few days after delivery, your breasts may begin to feel tender and full. This is called engorgement. Breast tenderness usually goes away within 48-72 hours after engorgement occurs. You may also notice milk leaking from your breasts. If you are not breastfeeding, do not stimulate your breasts. Breast  stimulation can make your breasts produce more milk.  Spending as much time as possible with your newborn is very important. During this time, you and your newborn can feel close and get to know each other. Having your newborn stay in your room (rooming in) will help to strengthen the bond with your newborn. It will give you time to get to know your newborn and become comfortable caring for your newborn.  Your hormones change after delivery. Sometimes the hormone changes can temporarily cause you to feel sad or tearful. These feelings should not last more than a few  days. If these feelings last longer than that, you should talk to your caregiver.  If desired, talk to your caregiver about methods of family planning or contraception.  Talk to your caregiver about immunizations. Your caregiver may want you to have the following immunizations before leaving the hospital:  Tetanus, diphtheria, and pertussis (Tdap) or tetanus and diphtheria (Td) immunization. It is very important that you and your family (including grandparents) or others caring for your newborn are up-to-date with the Tdap or Td immunizations. The Tdap or Td immunization can help protect your newborn from getting ill.  Rubella immunization.  Varicella (chickenpox) immunization.  Influenza immunization. You should receive this annual immunization if you did not receive the immunization during your pregnancy. Document Released: 06/01/2012 Document Reviewed: 06/01/2012 [facility] Patient Information 2015 [place], [place]. This information is not intended to replace advice given to you by your health care provider. Make sure you discuss any questions you have with your health care provider.

## 2015-03-02 NOTE — Discharge Summary (Signed)
Obstetric Discharge Summary Reason for Admission: cesarean section for breech presentation Prenatal Procedures: none Intrapartum Procedures: cesarean: low cervical, transverse Postpartum Procedures: none Complications-Operative and Postpartum: none HEMOGLOBIN  Date Value Ref Range Status  03/01/2015 8.7* 12.0 - 15.0 g/dL Final   HCT  Date Value Ref Range Status  03/01/2015 25.7* 36.0 - 46.0 % Final    Physical Exam:  General: alert, cooperative and appears stated age 39: appropriate Uterine Fundus: firm Incision: healing well, no significant drainage, no dehiscence DVT Evaluation: No evidence of DVT seen on physical exam.  Discharge Diagnoses: Term Pregnancy-delivered and and GDM  Discharge Information: Date: 03/02/2015 Activity: pelvic rest Diet: routine Medications: PNV, Ibuprofen, Colace, Iron and Percocet Condition: stable Instructions: refer to practice specific booklet Discharge to: home Follow-up Information    Follow up with PINN, Sanjuana Mae, MD In 10 days.   Specialty:  Obstetrics and Gynecology   Why:  For wound re-check   Contact information:   [address] [place] [address] [phone]       Newborn Data: Live born female  Birth Weight: 8 lb 5.7 oz (3790 g) APGAR: 8, 9  Home with mother.  Savannah Owens Savannah Owens 03/02/2015, 9:02 AM

## 2015-03-02 NOTE — Progress Notes (Addendum)
Patient is doing well.  She is tolerating PO, ambulating, voiding.  Pain is controlled.  Lochia is appropriate  Filed Vitals:   03/01/15 0900 03/01/15 1300 03/01/15 1902 03/02/15 0609  BP: 110/62 107/60 114/61 106/60  Pulse: 73 70 68 68  Temp: 98.4 F (36.9 C) 98.2 F (36.8 C) 98.5 F (36.9 C) 97.3 F (36.3 C)  TempSrc: Oral Oral Oral Oral  Resp: 16 18 18 18   SpO2: 99% 100%  100%    NAD Abdomen:  soft, appropriate tenderness, incisions intact and without erythema or drainage ext:    Symmetric, no edema bilaterally  Lab Results  Component Value Date   WBC 9.2 03/01/2015   HGB 8.7* 03/01/2015   HCT 25.7* 03/01/2015   MCV 82.6 03/01/2015   PLT 152 03/01/2015   BG 94  --/--/O POS, O POS (06/08 1030)/RImmune  A/P    39 y.o. G3P1001 POD #2 s/p 1 LTCS and BTL for breech presentation Routine post op and postpartum care.   Meeting all goals.  Desires d/c today.  GDMA1: plan 2hr gtt in offie at 6 wks Plan for circ as outpatient in office

## 2016-02-25 ENCOUNTER — Encounter (HOSPITAL_COMMUNITY): Payer: Self-pay

## 2016-02-25 ENCOUNTER — Emergency Department (HOSPITAL_COMMUNITY)
Admission: EM | Admit: 2016-02-25 | Discharge: 2016-02-25 | Disposition: A | Discharge status: Home / Self Care | Payer: Medicaid Other | Attending: Emergency Medicine | Admitting: Emergency Medicine

## 2016-02-25 DIAGNOSIS — R42 Dizziness and giddiness: Secondary | ICD-10-CM

## 2016-02-25 DIAGNOSIS — Z862 Personal history of diseases of the blood and blood-forming organs and certain disorders involving the immune mechanism: Secondary | ICD-10-CM | POA: Insufficient documentation

## 2016-02-25 DIAGNOSIS — Z3202 Encounter for pregnancy test, result negative: Secondary | ICD-10-CM | POA: Insufficient documentation

## 2016-02-25 DIAGNOSIS — R112 Nausea with vomiting, unspecified: Secondary | ICD-10-CM | POA: Insufficient documentation

## 2016-02-25 DIAGNOSIS — Z8632 Personal history of gestational diabetes: Secondary | ICD-10-CM | POA: Insufficient documentation

## 2016-02-25 LAB — POC URINE PREG, ED: Preg Test, Ur: NEGATIVE

## 2016-02-25 LAB — CBG MONITORING, ED: GLUCOSE-CAPILLARY: 89 mg/dL (ref 65–99)

## 2016-02-25 MED ORDER — SODIUM CHLORIDE 0.9 % IV BOLUS (SEPSIS)
1000.0000 mL | Freq: Once | INTRAVENOUS | Status: AC
  Administered 2016-02-25: 1000 mL via INTRAVENOUS

## 2016-02-25 MED ORDER — ACETAMINOPHEN 325 MG PO TABS
650.0000 mg | ORAL_TABLET | Freq: Once | ORAL | Status: AC
  Administered 2016-02-25: 650 mg via ORAL
  Filled 2016-02-25: qty 2

## 2016-02-25 MED ORDER — MECLIZINE HCL 25 MG PO TABS: 25.0000 mg | ORAL_TABLET | Freq: Three times a day (TID) | ORAL | Status: AC | PRN

## 2016-02-25 MED ORDER — MECLIZINE HCL 25 MG PO TABS
50.0000 mg | ORAL_TABLET | Freq: Once | ORAL | Status: AC
  Administered 2016-02-25: 50 mg via ORAL
  Filled 2016-02-25: qty 2

## 2016-02-25 NOTE — Discharge Instructions (Signed)
Benign Positional Vertigo Vertigo is the feeling that you or your surroundings are moving when they are not. Benign positional vertigo is the most common form of vertigo. The cause of this condition is not serious (is benign). This condition is triggered by certain movements and positions (is positional). This condition can be dangerous if it occurs while you are doing something that could endanger you or others, such as driving.  CAUSES In many cases, the cause of this condition is not known. It may be caused by a disturbance in an area of the inner ear that helps your brain to sense movement and balance. This disturbance can be caused by a viral infection (labyrinthitis), head injury, or repetitive motion. RISK FACTORS This condition is more likely to develop in:  Women.  People who are 50 years of age or older. SYMPTOMS Symptoms of this condition usually happen when you move your head or your eyes in different directions. Symptoms may start suddenly, and they usually last for less than a minute. Symptoms may include:  Loss of balance and falling.  Feeling like you are spinning or moving.  Feeling like your surroundings are spinning or moving.  Nausea and vomiting.  Blurred vision.  Dizziness.  Involuntary eye movement (nystagmus). Symptoms can be mild and cause only slight annoyance, or they can be severe and interfere with daily life. Episodes of benign positional vertigo may return (recur) over time, and they may be triggered by certain movements. Symptoms may improve over time. DIAGNOSIS This condition is usually diagnosed by medical history and a physical exam of the head, neck, and ears. You may be referred to a health care provider who specializes in ear, nose, and throat (ENT) problems (otolaryngologist) or a provider who specializes in disorders of the nervous system (neurologist). You may have additional testing, including:  MRI.  A CT scan.  Eye movement tests. Your  health care provider may ask you to change positions quickly while he or she watches you for symptoms of benign positional vertigo, such as nystagmus. Eye movement may be tested with an electronystagmogram (ENG), caloric stimulation, the Dix-Hallpike test, or the roll test.  An electroencephalogram (EEG). This records electrical activity in your brain.  Hearing tests. TREATMENT Usually, your health care provider will treat this by moving your head in specific positions to adjust your inner ear back to normal. Surgery may be needed in severe cases, but this is rare. In some cases, benign positional vertigo may resolve on its own in 2-4 weeks. HOME CARE INSTRUCTIONS Safety  Move slowly.Avoid sudden body or head movements.  Avoid driving.  Avoid operating heavy machinery.  Avoid doing any tasks that would be dangerous to you or others if a vertigo episode would occur.  If you have trouble walking or keeping your balance, try using a cane for stability. If you feel dizzy or unstable, sit down right away.  Return to your normal activities as told by your health care provider. Ask your health care provider what activities are safe for you. General Instructions  Take over-the-counter and prescription medicines only as told by your health care provider.  Avoid certain positions or movements as told by your health care provider.  Drink enough fluid to keep your urine clear or pale yellow.  Keep all follow-up visits as told by your health care provider. This is important. SEEK MEDICAL CARE IF:  You have a fever.  Your condition gets worse or you develop new symptoms.  Your family or friends   notice any behavioral changes.  Your nausea or vomiting gets worse.  You have numbness or a "pins and needles" sensation. SEEK IMMEDIATE MEDICAL CARE IF:  You have difficulty speaking or moving.  You are always dizzy.  You faint.  You develop severe headaches.  You have weakness in your  legs or arms.  You have changes in your hearing or vision.  You develop a stiff neck.  You develop sensitivity to light.   This information is not intended to replace advice given to you by your health care provider. Make sure you discuss any questions you have with your health care provider.   Document Released: 06/15/2006 Document Revised: 05/29/2015 Document Reviewed: 12/31/2014 Elsevier Interactive Patient Education 2016 [employer]  

## 2016-02-25 NOTE — ED Notes (Signed)
Pt. Coming from home via [facility] for dizziness. Pt. Reports laying in bed when the episode of dizziness started. The patient was found laying on the couch when EMS arrived and sts she was unable to move without feeling dizzy. Pt. Threw up as she was entering the EMS truck ~100 cc brown emesis. Pt. Denies any pain. Pt. Tearful upon arrival stating she is scared. Pt. Given 4mg  zofran en route.

## 2016-02-25 NOTE — ED Provider Notes (Signed)
CSN: [id number]     Arrival date & time 02/25/16  0957 History   First MD Initiated Contact with Patient 02/25/16 1011     Chief Complaint  Patient presents with  . Dizziness    Savannah Owens is a 40 y.o. female who presents to the ED Via EMS complaining of sudden onset of room spinning dizziness today. She reports she was laying in bed when her symptoms began. She reports her symptoms are worse with movement of her head. She feels the room is spinning around her. She is taking nothing for treatment of her symptoms today. She denies history of vertigo. Her last menstrual cycle was one week ago. While with the investigation of one episode of vomiting. She reports nausea resolved after Zofran by EMS. Patient denies fevers, headache, changes to her vision, numbness, tingling, weakness, urinary symptoms, chest pain, shortness of breath, abdominal pain, or diarrhea.  Patient is a 40 y.o. female presenting with dizziness. The history is provided by the patient. No language interpreter was used.  Dizziness Associated symptoms: nausea and vomiting   Associated symptoms: no chest pain, no diarrhea, no headaches, no shortness of breath and no weakness     Past Medical History  Diagnosis Date  . GDM (gestational diabetes mellitus)   . Anemia    Past Surgical History  Procedure Laterality Date  . No past surgeries    . Cesarean section N/A 02/28/2015    Procedure: CESAREAN SECTION;  Surgeon: Essie HartWalda Pinn, MD;  Location: [facility];  Service: Obstetrics;  Laterality: N/A;  . Tubal ligation Bilateral 02/28/2015    Procedure: BILATERAL TUBAL LIGATION;  Surgeon: Essie HartWalda Pinn, MD;  Location: [facility];  Service: Obstetrics;  Laterality: Bilateral;   History reviewed. No pertinent family history. Social History  Substance Use Topics  . Smoking status: Never Smoker   . Smokeless tobacco: None  . Alcohol Use: No   OB History    Gravida Para Term Preterm AB TAB SAB Ectopic Multiple Living   3 3 1       0 3      Review of Systems  Constitutional: Negative for fever and chills.  HENT: Negative for congestion, ear pain and sore throat.   Eyes: Negative for visual disturbance.  Respiratory: Negative for cough and shortness of breath.   Cardiovascular: Negative for chest pain.  Gastrointestinal: Positive for nausea and vomiting. Negative for abdominal pain and diarrhea.  Genitourinary: Negative for dysuria.  Musculoskeletal: Negative for back pain and neck pain.  Skin: Negative for rash.  Neurological: Positive for dizziness. Negative for seizures, syncope, weakness, light-headedness, numbness and headaches.      Allergies  Review of patient's allergies indicates no known allergies.  Home Medications   Prior to Admission medications   Medication Sig Start Date End Date Taking? Authorizing Provider  ibuprofen (ADVIL,MOTRIN) 600 MG tablet Take 1 tablet (600 mg total) by mouth every 6 (six) hours as needed for moderate pain or cramping. 03/02/15  Yes Marlow Baarsyanna Clark, MD  meclizine (ANTIVERT) 25 MG tablet Take 1 tablet (25 mg total) by mouth 3 (three) times daily as needed for dizziness. 02/25/16   Everlene FarrierWilliam Vermelle Cammarata, PA-C  oxyCODONE-acetaminophen (PERCOCET/ROXICET) 5-325 MG per tablet Take 1-2 tablets by mouth every 6 (six) hours as needed (for pain scale 4-7). Patient not taking: Reported on 02/25/2016 03/02/15   Marlow Baarsyanna Clark, MD   BP 111/66 mmHg  Pulse 64  Temp(Src) 97.7 F (36.5 C) (Oral)  Resp 10  Ht 5\' 3"  (1.6 m)  Wt 67.132 kg  BMI 26.22 kg/m2  SpO2 100% Physical Exam  Constitutional: She is oriented to person, place, and time. She appears well-developed and well-nourished. No distress.  Nontoxic appearing.  HENT:  Head: Normocephalic and atraumatic.  Right Ear: External ear normal.  Left Ear: External ear normal.  Mouth/Throat: Oropharynx is clear and moist. No oropharyngeal exudate.  Bilateral tympanic membranes are pearly-gray without erythema or loss of landmarks.  Throat clear.  Eyes:  Conjunctivae and EOM are normal. Pupils are equal, round, and reactive to light. Right eye exhibits no discharge. Left eye exhibits no discharge.  Neck: Normal range of motion. Neck supple. No JVD present.  Cardiovascular: Normal rate, regular rhythm, normal heart sounds and intact distal pulses.  Exam reveals no gallop and no friction rub.   No murmur heard. Pulmonary/Chest: Effort normal and breath sounds normal. No stridor. No respiratory distress. She has no wheezes. She has no rales.  Abdominal: Soft. There is no tenderness. There is no guarding.  Musculoskeletal: Normal range of motion. She exhibits no edema.  Good strength to her bilateral upper and lower extremities.  Lymphadenopathy:    She has no cervical adenopathy.  Neurological: She is alert and oriented to person, place, and time. No cranial nerve deficit. Coordination normal.  Patient is alert and oriented 3.  Cranial nerves are intact. Speech is clear and coherent. EOMs are intact. Sensation is intact in her bilateral upper and lower extremities.  Skin: Skin is warm and dry. No rash noted. She is not diaphoretic. No erythema. No pallor.  Psychiatric: She has a normal mood and affect. Her behavior is normal.  Nursing note and vitals reviewed.   ED Course  Procedures (including critical care time) Labs Review Labs Reviewed  CBG MONITORING, ED  POC URINE PREG, ED    Imaging Review No results found. I have personally reviewed and evaluated these images and lab results as part of my medical decision-making.   EKG Interpretation   Date/Time:  Tuesday February 25 2016 10:02:59 EDT Ventricular Rate:  66 PR Interval:  352 QRS Duration: 102 QT Interval:  385 QTC Calculation: 403 R Axis:   70 Text Interpretation:  Sinus rhythm Prolonged PR interval Otherwise normal  ECG No previous tracing Confirmed by KNOTT MD, DANIEL ([id number]) on 02/25/2016  10:12:40 AM      Filed Vitals:   02/25/16 1001 02/25/16 1134  BP: 122/77 111/66   Pulse: 65 64  Temp: 97.7 F (36.5 C)   TempSrc: Oral   Resp: 16 10  Height:  (1.6 m)   Weight: 67.132 kg   SpO2: 100% 100%     MDM   Meds given in ED:  Medications  acetaminophen (TYLENOL) tablet 650 mg (not administered)  meclizine (ANTIVERT) tablet 50 mg (50 mg Oral Given 02/25/16 1045)  sodium chloride 0.9 % bolus 1,000 mL (0 mLs Intravenous Stopped 02/25/16 1222)    New Prescriptions   MECLIZINE (ANTIVERT) 25 MG TABLET    Take 1 tablet (25 mg total) by mouth 3 (three) times daily as needed for dizziness.    Final diagnoses:  Vertigo   This  is a 41 y.o. female who presents to the ED Via [facility] complaining of sudden onset of room spinning dizziness today. She reports she was laying in bed when her symptoms began. She reports her symptoms are worse with movement of her head. She feels the room is spinning around her. She is taking nothing for treatment of her symptoms today.  She denies history of vertigo. Her last menstrual cycle was one week ago. While with the investigation of one episode of vomiting. She reports nausea resolved after Zofran by EMS. Patient denies fevers, headache, changes to her vision, numbness, tingling, weakness.  On exam the patient is afebrile and non-toxic appearing. She has no focal neurological deficits. She reports increased dizziness with movement of her head. Pregnancy test is negative. CBG is 89. Patient was provided with meclizine and a fluid bolus. At reevaluation patient reports her room spinning dizziness has resolved. She reports she feels fatigued. I suspect this is a side effect from the meclizine. Patient is able to ambulate with normal gait in the room. She denies feeling dizzy or lightheaded. She denies feeling off balance. Patient with vertigo. Will discharge with prescription for meclizine 25 mg 3 times a day. I discussed the treatment and expected course of vertigo. I encouraged her to follow-up with primary care. I discussed return  precautions. I advised the patient to follow-up with their primary care provider this week. I advised the patient to return to the emergency department with new or worsening symptoms or new concerns. The patient verbalized understanding and agreement with plan.      Everlene Farrier, PA-C 02/25/16 1233  Lyndal Pulley, MD 02/25/16 [phone]

## 2017-11-19 ENCOUNTER — Emergency Department (HOSPITAL_COMMUNITY): Payer: Medicaid Other

## 2017-11-19 ENCOUNTER — Encounter (HOSPITAL_COMMUNITY): Payer: Self-pay | Admitting: Emergency Medicine

## 2017-11-19 ENCOUNTER — Emergency Department (HOSPITAL_COMMUNITY)
Admission: EM | Admit: 2017-11-19 | Discharge: 2017-11-19 | Disposition: A | Discharge status: Home / Self Care | Payer: Medicaid Other | Attending: Emergency Medicine | Admitting: Emergency Medicine

## 2017-11-19 DIAGNOSIS — R51 Headache: Secondary | ICD-10-CM | POA: Insufficient documentation

## 2017-11-19 DIAGNOSIS — M25552 Pain in left hip: Secondary | ICD-10-CM | POA: Diagnosis not present

## 2017-11-19 DIAGNOSIS — R109 Unspecified abdominal pain: Secondary | ICD-10-CM | POA: Diagnosis not present

## 2017-11-19 LAB — I-STAT CHEM 8, ED
BUN: 10 mg/dL (ref 6–20)
Calcium, Ion: 1.22 mmol/L (ref 1.15–1.40)
Chloride: 103 mmol/L (ref 101–111)
Creatinine, Ser: 0.9 mg/dL (ref 0.44–1.00)
Glucose, Bld: 111 mg/dL — ABNORMAL HIGH (ref 65–99)
HEMATOCRIT: 37 % (ref 36.0–46.0)
HEMOGLOBIN: 12.6 g/dL (ref 12.0–15.0)
POTASSIUM: 3.8 mmol/L (ref 3.5–5.1)
Sodium: 141 mmol/L (ref 135–145)
TCO2: 26 mmol/L (ref 22–32)

## 2017-11-19 LAB — I-STAT BETA HCG BLOOD, ED (MC, WL, AP ONLY): I-stat hCG, quantitative: <5 m[IU]/mL (ref <5)

## 2017-11-19 MED ORDER — SODIUM CHLORIDE 0.9 % IV BOLUS (SEPSIS)
1000.0000 mL | Freq: Once | INTRAVENOUS | Status: AC
  Administered 2017-11-19: 1000 mL via INTRAVENOUS

## 2017-11-19 MED ORDER — TRAMADOL HCL 50 MG PO TABS: ORAL_TABLET | ORAL | 0 refills | Status: AC

## 2017-11-19 MED ORDER — ONDANSETRON HCL 4 MG/2ML IJ SOLN
4.0000 mg | Freq: Once | INTRAMUSCULAR | Status: AC
  Administered 2017-11-19: 4 mg via INTRAVENOUS
  Filled 2017-11-19: qty 2

## 2017-11-19 MED ORDER — HYDROMORPHONE HCL 1 MG/ML IJ SOLN
0.5000 mg | Freq: Once | INTRAMUSCULAR | Status: AC
  Administered 2017-11-19: 0.5 mg via INTRAVENOUS
  Filled 2017-11-19: qty 1

## 2017-11-19 MED ORDER — IOPAMIDOL (ISOVUE-300) INJECTION 61%
INTRAVENOUS | Status: AC
  Administered 2017-11-19: 100 mL
  Filled 2017-11-19: qty 100

## 2017-11-19 NOTE — Discharge Instructions (Signed)
Follow-up with your family doctor next week if any problems 
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
## 2020-03-11 ENCOUNTER — Other Ambulatory Visit: Payer: Self-pay | Admitting: Family Medicine

## 2020-03-11 ENCOUNTER — Other Ambulatory Visit (HOSPITAL_COMMUNITY)
Admission: RE | Admit: 2020-03-11 | Discharge: 2020-03-11 | Disposition: A | Discharge status: Home / Self Care | Payer: Managed Care, Other (non HMO) | Source: Ambulatory Visit | Attending: Family Medicine | Admitting: Family Medicine

## 2020-03-11 DIAGNOSIS — Z01411 Encounter for gynecological examination (general) (routine) with abnormal findings: Secondary | ICD-10-CM | POA: Insufficient documentation

## 2020-03-12 ENCOUNTER — Other Ambulatory Visit: Payer: Self-pay | Admitting: Family Medicine

## 2020-03-12 DIAGNOSIS — Z1231 Encounter for screening mammogram for malignant neoplasm of breast: Secondary | ICD-10-CM

## 2020-03-12 LAB — CYTOLOGY - PAP
Comment: NEGATIVE
Diagnosis: NEGATIVE
High risk HPV: NEGATIVE

## 2020-03-21 ENCOUNTER — Ambulatory Visit
Admission: RE | Admit: 2020-03-21 | Discharge: 2020-03-21 | Disposition: A | Discharge status: Home / Self Care | Payer: Managed Care, Other (non HMO) | Source: Ambulatory Visit | Attending: Family Medicine | Admitting: Family Medicine

## 2020-03-21 ENCOUNTER — Other Ambulatory Visit: Payer: Self-pay

## 2020-03-21 DIAGNOSIS — Z1231 Encounter for screening mammogram for malignant neoplasm of breast: Secondary | ICD-10-CM

## 2022-02-05 ENCOUNTER — Other Ambulatory Visit: Payer: Self-pay | Admitting: Family Medicine

## 2022-02-05 DIAGNOSIS — Z1231 Encounter for screening mammogram for malignant neoplasm of breast: Secondary | ICD-10-CM

## 2022-02-19 ENCOUNTER — Ambulatory Visit
Admission: RE | Admit: 2022-02-19 | Discharge: 2022-02-19 | Disposition: A | Discharge status: Home / Self Care | Payer: Managed Care, Other (non HMO) | Source: Ambulatory Visit | Attending: Family Medicine | Admitting: Family Medicine

## 2022-02-19 DIAGNOSIS — Z1231 Encounter for screening mammogram for malignant neoplasm of breast: Secondary | ICD-10-CM

## 2023-12-06 IMAGING — MG MM DIGITAL SCREENING BILAT W/ TOMO AND CAD
6 of 10 series · 6 of 30 positions shown · non-contrast
Comparison: Previous exam(s).

CLINICAL DATA: Screening.

EXAM:
DIGITAL SCREENING BILATERAL MAMMOGRAM WITH TOMOSYNTHESIS AND CAD
TECHNIQUE: Bilateral screening digital craniocaudal and mediolateral oblique
mammograms were obtained. Bilateral screening digital breast
tomosynthesis was performed. The images were evaluated with
computer-aided detection.

[R MLO synth-2D]
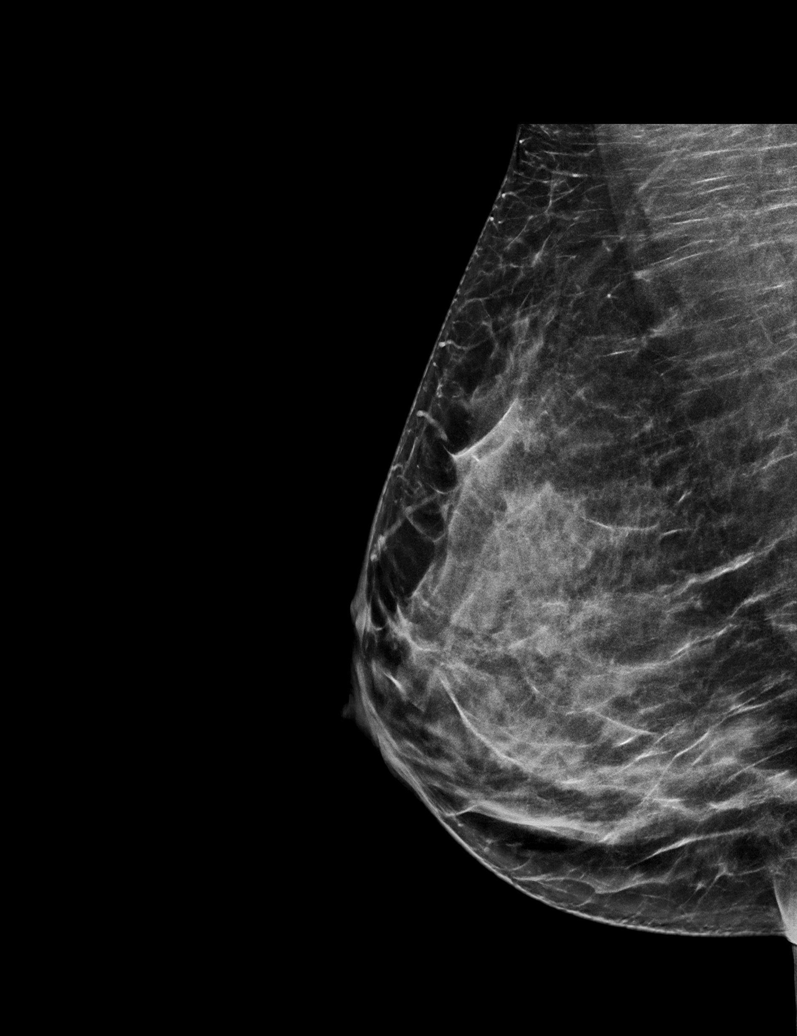

[R XCCL synth-2D]
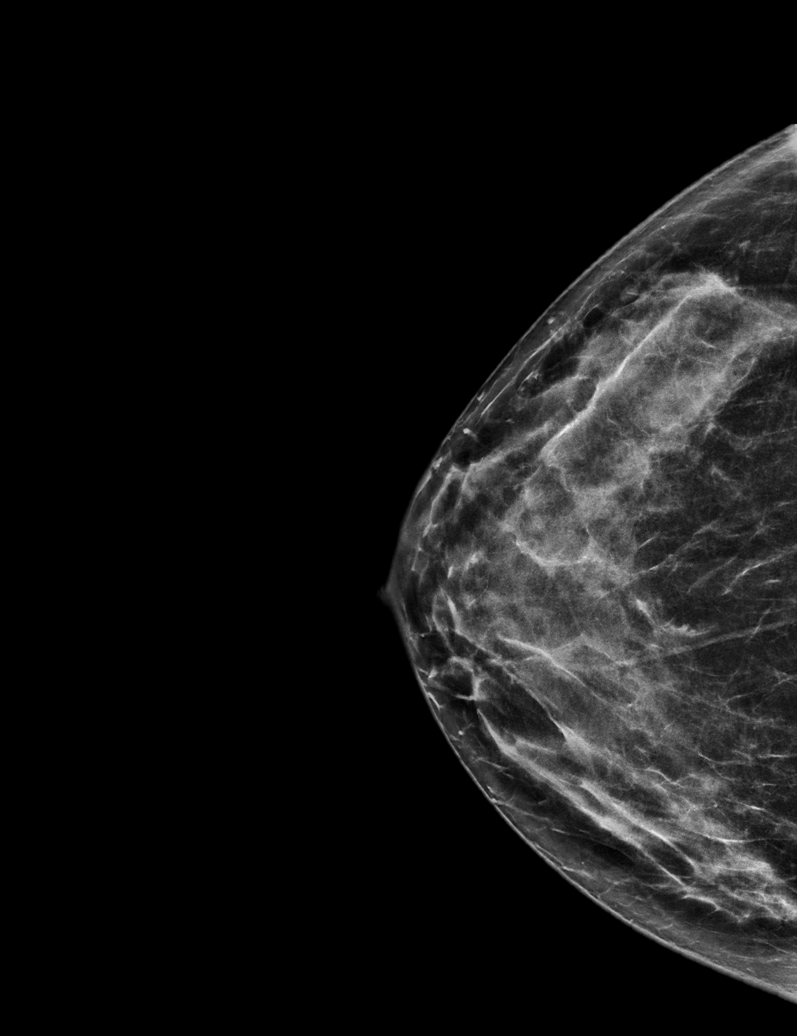

[L CC synth-2D]
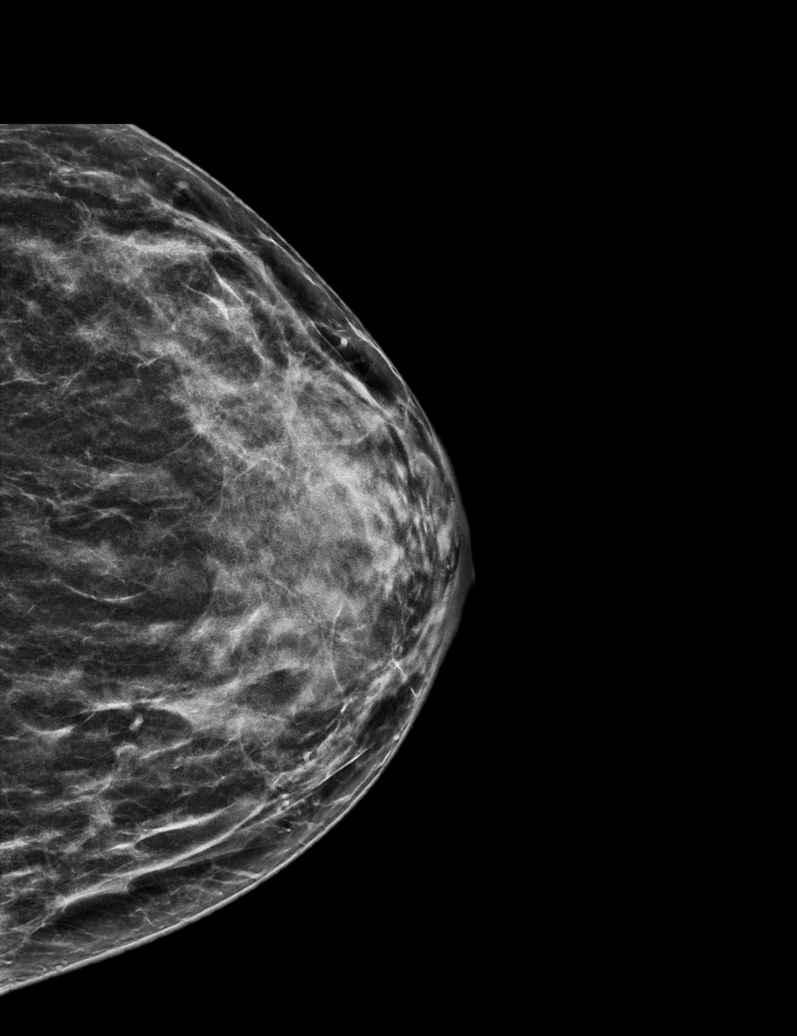

[R CC synth-2D]
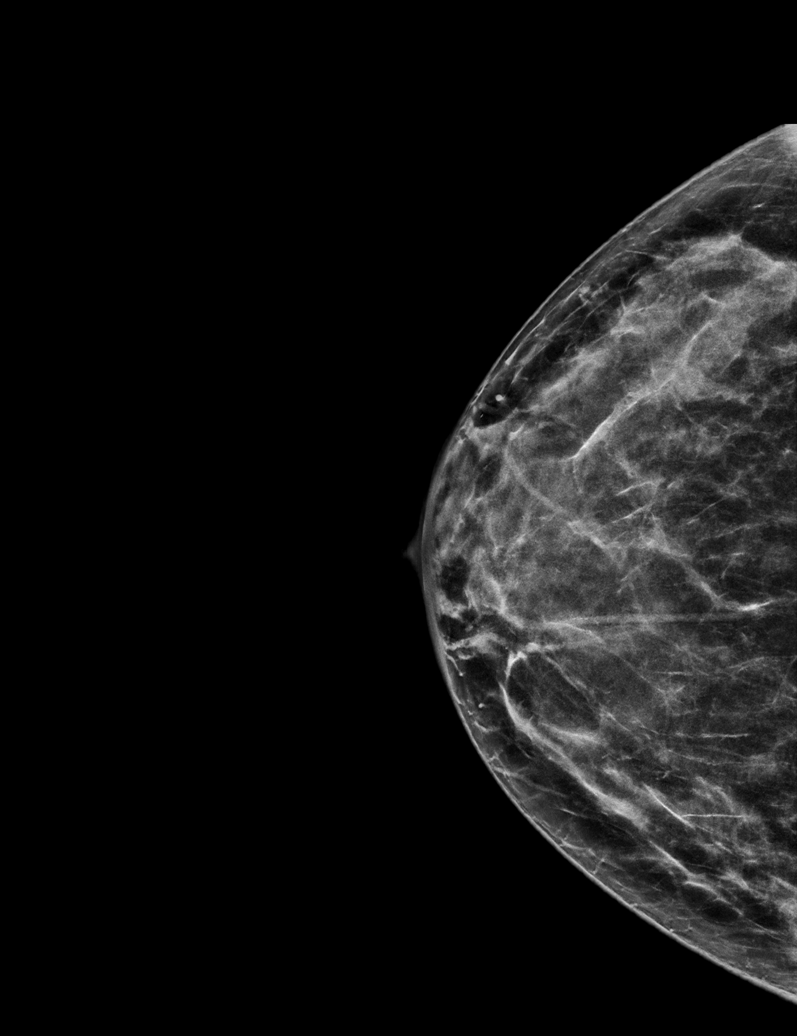

[L MLO synth-2D]
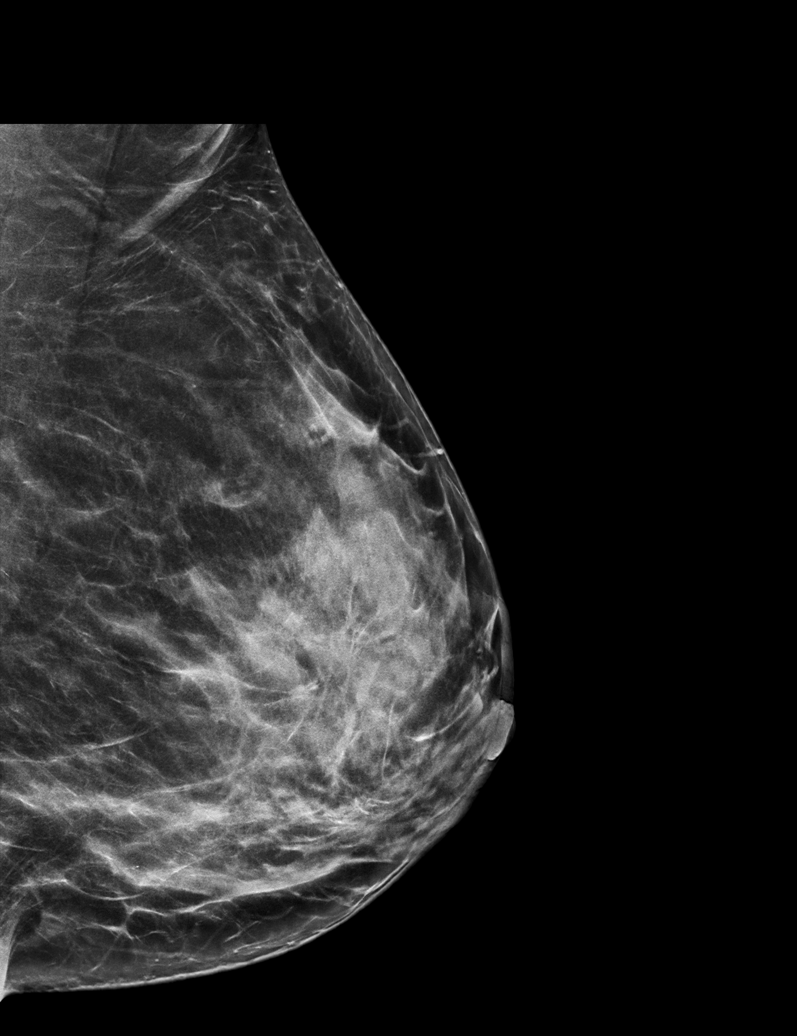

[R MLO tomo · tomo slice 31/60.0]
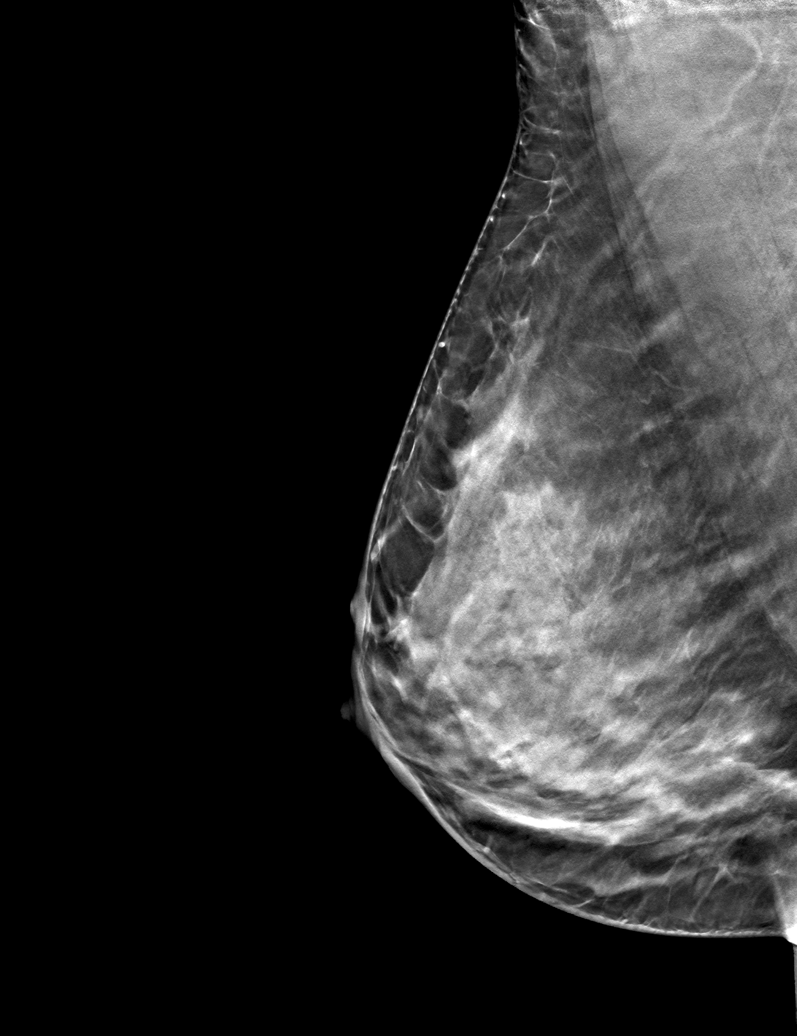

[6 of 30 positions shown; findings below may reference images not displayed]

ACR Breast Density Category c: The breast tissue is heterogeneously
dense, which may obscure small masses.
FINDINGS: There are no findings suspicious for malignancy.
IMPRESSION: No mammographic evidence of malignancy. A result letter of this
screening mammogram will be mailed directly to the patient.

RECOMMENDATION:
Screening mammogram in one year. (Code:Q3-W-BC3)

BI-RADS CATEGORY  1: Negative.
